# Patient Record
Sex: Male | Born: 1960 | Race: Black or African American | Hispanic: No | Marital: Single
Health system: Southern US, Community
[De-identification: ages and names within clinical notes are randomized; demographics above are authoritative.]

---

## 2015-06-16 ENCOUNTER — Emergency Department (HOSPITAL_COMMUNITY)
Admission: EM | Admit: 2015-06-16 | Discharge: 2015-06-16 | Disposition: A | Discharge status: Home / Self Care | Payer: BLUE CROSS/BLUE SHIELD | Attending: Emergency Medicine | Admitting: Emergency Medicine

## 2015-06-16 ENCOUNTER — Emergency Department (HOSPITAL_COMMUNITY): Payer: BLUE CROSS/BLUE SHIELD

## 2015-06-16 ENCOUNTER — Encounter (HOSPITAL_COMMUNITY): Payer: Self-pay | Admitting: Radiology

## 2015-06-16 DIAGNOSIS — Y92410 Unspecified street and highway as the place of occurrence of the external cause: Secondary | ICD-10-CM | POA: Diagnosis not present

## 2015-06-16 DIAGNOSIS — Y9389 Activity, other specified: Secondary | ICD-10-CM | POA: Insufficient documentation

## 2015-06-16 DIAGNOSIS — S161XXA Strain of muscle, fascia and tendon at neck level, initial encounter: Secondary | ICD-10-CM | POA: Insufficient documentation

## 2015-06-16 DIAGNOSIS — S20212A Contusion of left front wall of thorax, initial encounter: Secondary | ICD-10-CM

## 2015-06-16 DIAGNOSIS — Y998 Other external cause status: Secondary | ICD-10-CM | POA: Insufficient documentation

## 2015-06-16 DIAGNOSIS — S199XXA Unspecified injury of neck, initial encounter: Secondary | ICD-10-CM | POA: Diagnosis present

## 2015-06-16 MED ORDER — IBUPROFEN 200 MG PO TABS
400.0000 mg | ORAL_TABLET | Freq: Once | ORAL | Status: AC
  Administered 2015-06-16: 400 mg via ORAL
  Filled 2015-06-16: qty 2

## 2015-06-16 MED ORDER — HYDROCODONE-ACETAMINOPHEN 5-325 MG PO TABS
1.0000 | ORAL_TABLET | Freq: Once | ORAL | Status: AC
  Administered 2015-06-16: 1 via ORAL
  Filled 2015-06-16: qty 1
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
## 2020-01-12 ENCOUNTER — Other Ambulatory Visit: Payer: Self-pay | Admitting: Unknown Physician Specialty

## 2020-01-12 ENCOUNTER — Telehealth: Payer: Self-pay | Admitting: Unknown Physician Specialty

## 2020-01-12 DIAGNOSIS — U071 COVID-19: Secondary | ICD-10-CM

## 2020-01-12 DIAGNOSIS — E119 Type 2 diabetes mellitus without complications: Secondary | ICD-10-CM

## 2020-01-12 NOTE — Telephone Encounter (Signed)
  I connected by phone with Peter Roach on 01/12/2020 at 4:58 PM to discuss the potential use of an new treatment for mild to moderate COVID-19 viral infection in non-hospitalized patients.  This patient is a 59 y.o. male that meets the FDA criteria for Emergency Use Authorization of bamlanivimab or casirivimab\imdevimab.  Has a (+) direct SARS-CoV-2 viral test result  Has mild or moderate COVID-19   Is ? 60 years of age and weighs ? 40 kg  Is NOT hospitalized due to COVID-19  Is NOT requiring oxygen therapy or requiring an increase in baseline oxygen flow rate due to COVID-19  Is within 10 days of symptom onset  Has at least one of the high risk factor(s) for progression to severe COVID-19 and/or hospitalization as defined in EUA.  Specific high risk criteria : Diabetes   I have spoken and communicated the following to the patient or parent/caregiver:  1. FDA has authorized the emergency use of bamlanivimab and casirivimab\imdevimab for the treatment of mild to moderate COVID-19 in adults and pediatric patients with positive results of direct SARS-CoV-2 viral testing who are 23 years of age and older weighing at least 40 kg, and who are at high risk for progressing to severe COVID-19 and/or hospitalization.  2. The significant known and potential risks and benefits of bamlanivimab and casirivimab\imdevimab, and the extent to which such potential risks and benefits are unknown.  3. Information on available alternative treatments and the risks and benefits of those alternatives, including clinical trials.  4. Patients treated with bamlanivimab and casirivimab\imdevimab should continue to self-isolate and use infection control measures (e.g., wear mask, isolate, social distance, avoid sharing personal items, clean and disinfect "high touch" surfaces, and frequent handwashing) according to CDC guidelines.   5. The patient or parent/caregiver has the option to accept or refuse  bamlanivimab or casirivimab\imdevimab .  After reviewing this information with the patient, The patient agreed to proceed with receiving the bamlanimivab infusion and will be provided a copy of the Fact sheet prior to receiving the infusion.[name] 01/12/2020 4:58 PM      Sx onset 1/8fever, chills, cough

## 2020-01-15 ENCOUNTER — Ambulatory Visit (HOSPITAL_COMMUNITY): Payer: BLUE CROSS/BLUE SHIELD

## 2020-01-15 ENCOUNTER — Ambulatory Visit (HOSPITAL_COMMUNITY)
Admission: RE | Admit: 2020-01-15 | Discharge: 2020-01-15 | Disposition: A | Discharge status: Home / Self Care | Payer: HRSA Program | Source: Ambulatory Visit | Attending: Pulmonary Disease | Admitting: Pulmonary Disease

## 2020-01-15 DIAGNOSIS — E119 Type 2 diabetes mellitus without complications: Secondary | ICD-10-CM | POA: Insufficient documentation

## 2020-01-15 DIAGNOSIS — U071 COVID-19: Secondary | ICD-10-CM | POA: Diagnosis not present

## 2020-01-15 DIAGNOSIS — Z794 Long term (current) use of insulin: Secondary | ICD-10-CM | POA: Insufficient documentation

## 2020-01-15 MED ORDER — SODIUM CHLORIDE 0.9 % IV SOLN
700.0000 mg | Freq: Once | INTRAVENOUS | Status: AC
  Administered 2020-01-15: 700 mg via INTRAVENOUS
  Filled 2020-01-15: qty 20

## 2020-01-15 MED ORDER — METHYLPREDNISOLONE SODIUM SUCC 125 MG IJ SOLR: 125.0000 mg | Freq: Once | INTRAMUSCULAR | Status: DC | PRN

## 2020-01-15 MED ORDER — ALBUTEROL SULFATE HFA 108 (90 BASE) MCG/ACT IN AERS: 2.0000 | INHALATION_SPRAY | Freq: Once | RESPIRATORY_TRACT | Status: DC | PRN

## 2020-01-15 MED ORDER — FAMOTIDINE IN NACL 20-0.9 MG/50ML-% IV SOLN: 20.0000 mg | Freq: Once | INTRAVENOUS | Status: DC | PRN

## 2020-01-15 MED ORDER — ACETAMINOPHEN 325 MG PO TABS
ORAL_TABLET | ORAL | Status: AC
  Filled 2020-01-15: qty 2

## 2020-01-15 MED ORDER — SODIUM CHLORIDE 0.9 % IV SOLN
INTRAVENOUS | Status: DC | PRN
  Administered 2020-01-15: 250 mL via INTRAVENOUS

## 2020-01-15 MED ORDER — ACETAMINOPHEN 325 MG PO TABS
650.0000 mg | ORAL_TABLET | Freq: Once | ORAL | Status: AC
  Administered 2020-01-15: 650 mg via ORAL

## 2020-01-15 MED ORDER — DIPHENHYDRAMINE HCL 50 MG/ML IJ SOLN: 50.0000 mg | Freq: Once | INTRAMUSCULAR | Status: DC | PRN

## 2020-01-15 MED ORDER — EPINEPHRINE 0.3 MG/0.3ML IJ SOAJ: 0.3000 mg | Freq: Once | INTRAMUSCULAR | Status: DC | PRN

## 2020-01-15 NOTE — Discharge Instructions (Signed)

## 2020-01-15 NOTE — Progress Notes (Signed)
  Diagnosis: COVID-19  Physician: Dr. Wright  Procedure: [facility] Med: bamlanivimab infusion - Provided patient with bamlanimivab fact sheet for patients, parents and caregivers prior to infusion.  Complications: No immediate complications noted.  Discharge: Discharged home   Peter Roach 01/15/2020   

## 2024-07-31 ENCOUNTER — Ambulatory Visit: Admitting: Internal Medicine

## 2024-07-31 ENCOUNTER — Encounter: Payer: Self-pay | Admitting: Internal Medicine

## 2024-07-31 VITALS — BP 126/88 | HR 73 | Temp 97.6°F | Resp 18 | Ht 72.0 in | Wt 214.0 lb

## 2024-07-31 DIAGNOSIS — H1033 Unspecified acute conjunctivitis, bilateral: Secondary | ICD-10-CM | POA: Insufficient documentation

## 2024-07-31 MED ORDER — OFLOXACIN 0.3 % OP SOLN: 1.0000 [drp] | Freq: Four times a day (QID) | OPHTHALMIC | 0 refills | Status: AC

## 2024-07-31 NOTE — Progress Notes (Signed)
   Acute Office Visit  Subjective:     Patient ID: Peter Roach, male    DOB: Jun 23, 1961, 63 y.o.   MRN: [id number]  Chief Complaint  Patient presents with   Allergies    Irritated eyes    HPI Patient is in today for redness and watery both eyes left worse than right for 3 days. Right eye started having symptoms yesterday. No pain, no blurry vision. He noted this morning that his eyes were sticky.   Review of Systems  Constitutional: Negative.   HENT: Negative.    Eyes:  Positive for discharge and redness.  Respiratory: Negative.    Cardiovascular: Negative.         Objective:    BP 126/88 (BP Location: Left Arm, Patient Position: Sitting, Cuff Size: Normal)   Pulse 73   Temp 97.6 F (36.4 C)   Resp 18   Ht 6' (1.829 m)   Wt 214 lb (97.1 kg)   SpO2 96%   BMI 29.02 kg/m    Physical Exam Constitutional:      Appearance: Normal appearance.  Eyes:     General:        Right eye: Discharge present.        Left eye: Discharge present. Neurological:     Mental Status: He is alert.     No results found for any visits on 07/31/24.      Assessment & Plan:   Problem List Items Addressed This Visit       Other   Acute conjunctivitis of both eyes - Primary   He will apply eye drops to both eyes and if not better then he will let us  know.       No orders of the defined types were placed in this encounter.   No follow-ups on file.  Roetta Dare, MD

## 2024-07-31 NOTE — Assessment & Plan Note (Signed)
 He will apply eye drops to both eyes and if not better then he will let us  know.
